# Patient Record
Sex: Male | Born: 1995 | Race: Black or African American | Hispanic: No | Marital: Single | Smoking: Never smoker
Health system: Southern US, Community
[De-identification: ages and names within clinical notes are randomized; demographics above are authoritative.]

## PROBLEM LIST (undated history)

## (undated) ENCOUNTER — Inpatient Hospital Stay: Admission: EM | Payer: Self-pay | Source: Home / Self Care

## (undated) VITALS — BP 106/72 | HR 90 | Temp 98.3°F | Resp 18 | Ht 73.62 in | Wt 245.8 lb

## (undated) DIAGNOSIS — J302 Other seasonal allergic rhinitis: Secondary | ICD-10-CM

## (undated) DIAGNOSIS — S83289A Other tear of lateral meniscus, current injury, unspecified knee, initial encounter: Secondary | ICD-10-CM

## (undated) DIAGNOSIS — Z8614 Personal history of Methicillin resistant Staphylococcus aureus infection: Secondary | ICD-10-CM

## (undated) DIAGNOSIS — S83519A Sprain of anterior cruciate ligament of unspecified knee, initial encounter: Secondary | ICD-10-CM

## (undated) HISTORY — PX: TYMPANOSTOMY TUBE PLACEMENT: SHX32

## (undated) HISTORY — DX: Sprain of anterior cruciate ligament of unspecified knee, initial encounter: S83.519A

---
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
## 2018-04-20 ENCOUNTER — Emergency Department (HOSPITAL_COMMUNITY)
Admission: EM | Admit: 2018-04-20 | Discharge: 2018-04-21 | Disposition: A | Discharge status: Home / Self Care | Payer: PRIVATE HEALTH INSURANCE | Attending: Emergency Medicine | Admitting: Emergency Medicine

## 2018-04-20 ENCOUNTER — Other Ambulatory Visit: Payer: Self-pay

## 2018-04-20 ENCOUNTER — Encounter (HOSPITAL_COMMUNITY): Payer: Self-pay | Admitting: Emergency Medicine

## 2018-04-20 DIAGNOSIS — Z79899 Other long term (current) drug therapy: Secondary | ICD-10-CM | POA: Insufficient documentation

## 2018-04-20 DIAGNOSIS — F32A Depression, unspecified: Secondary | ICD-10-CM

## 2018-04-20 DIAGNOSIS — F329 Major depressive disorder, single episode, unspecified: Secondary | ICD-10-CM

## 2018-04-20 DIAGNOSIS — R45851 Suicidal ideations: Secondary | ICD-10-CM | POA: Insufficient documentation

## 2018-04-20 LAB — CBC
HCT: 39.7 % (ref 39.0–52.0)
Hemoglobin: 13 g/dL (ref 13.0–17.0)
MCH: 28.8 pg (ref 26.0–34.0)
MCHC: 32.7 g/dL (ref 30.0–36.0)
MCV: 87.8 fL (ref 80.0–100.0)
Platelets: 249 10*3/uL (ref 150–400)
RBC: 4.52 MIL/uL (ref 4.22–5.81)
RDW: 11.8 % (ref 11.5–15.5)
WBC: 8.7 10*3/uL (ref 4.0–10.5)
nRBC: 0 % (ref 0.0–0.2)

## 2018-04-20 NOTE — ED Notes (Signed)
ED Provider at bedside. 

## 2018-04-20 NOTE — ED Provider Notes (Signed)
[facility] Provider Note   CSN: [id number] Arrival date & time: 04/20/18  2308     History   Chief Complaint Chief Complaint  Patient presents with  . Suicidal    HPI George Cole is a 22 y.o. male.  Patient to ED with complaint of depression with SI. He states he had suicidal thoughts earlier today but none now. History of suicide attempt in the past. No HI/AVH. He denies substance abuse issues.   The history is provided by the patient and the police.    Past Medical History:  Diagnosis Date  . ACL sprain   . History of MRSA infection    right knee  . Lateral meniscal tear 04/2013   left knee  . Seasonal allergies     Patient Active Problem List   Diagnosis Date Noted  . Seasonal allergies   . History of MRSA infection   . ACL sprain   . Lateral meniscal tear 04/28/2013  . Suicidal ideation 10/07/2011  . Depression 10/07/2011  . Anxiety disorder 10/07/2011    Past Surgical History:  Procedure Laterality Date  . KNEE ARTHROSCOPY W/ MENISCAL REPAIR Left 2014  . KNEE ARTHROSCOPY WITH LATERAL MENISECTOMY Left 05/06/2013   Procedure: LEFT KNEE ARTHROSCOPY WITH LATERAL MENISCECTOMY;  Surgeon: Nilda Simmerobert A Wainer, MD;  Location: [facility];  Service: Orthopedics;  Laterality: Left;  . TYMPANOSTOMY TUBE PLACEMENT     x 2 as a child        Home Medications    Prior to Admission medications   Medication Sig Start Date End Date Taking? Authorizing Provider  HYDROcodone-acetaminophen (NORCO) 5-325 MG per tablet 1-2 tablets every 4-6 hrs as needed for pain Patient not taking: Reported on 05/20/2014 05/06/13   Shepperson, Kirstin, PA-C  levocetirizine (XYZAL) 5 MG tablet Take 5 mg by mouth every evening.    [provider]  mometasone (NASONEX) 50 MCG/ACT nasal spray Place 2 sprays into the nose daily.    [provider]    Family History Family History  Problem Relation Age of Onset  .  Hypertension Father     Social History Social History   Tobacco Use  . Smoking status: Never Smoker  . Smokeless tobacco: Never Used  Substance Use Topics  . Alcohol use: No  . Drug use: No     Allergies   Patient has no known allergies.   Review of Systems Review of Systems  Constitutional: Negative for chills and fever.  HENT: Negative.   Respiratory: Negative.   Cardiovascular: Negative.   Gastrointestinal: Negative.   Musculoskeletal: Negative.   Skin: Negative.   Neurological: Negative.   Psychiatric/Behavioral: Positive for dysphoric mood and suicidal ideas.     Physical Exam Updated Vital Signs BP (!) 156/76 (BP Location: Right Arm)   Pulse 76   Temp (!) 97.5 F (36.4 C) (Oral)   Ht 6\' 1"  (1.854 m)   Wt 102.1 kg   SpO2 100%   BMI 29.69 kg/m   Physical Exam Constitutional:      Appearance: He is well-developed.  HENT:     Head: Normocephalic.  Neck:     Musculoskeletal: Normal range of motion and neck supple.  Cardiovascular:     Rate and Rhythm: Normal rate and regular rhythm.     Heart sounds: No murmur.  Pulmonary:     Effort: Pulmonary effort is normal.     Breath sounds: Normal breath sounds. No wheezing, rhonchi or  rales.  Abdominal:     General: Bowel sounds are normal.     Palpations: Abdomen is soft.     Tenderness: There is no abdominal tenderness. There is no guarding or rebound.  Musculoskeletal: Normal range of motion.  Skin:    General: Skin is warm and dry.     Findings: No rash.  Neurological:     General: No focal deficit present.     Mental Status: He is alert and oriented to person, place, and time.  Psychiatric:        Attention and Perception: Attention normal. He does not perceive auditory or visual hallucinations.        Mood and Affect: Mood is depressed.        Speech: Speech normal.        Behavior: Behavior is cooperative.        Thought Content: Thought content includes suicidal ideation.      ED  Treatments / Results  Labs (all labs ordered are listed, but only abnormal results are displayed) Labs Reviewed  COMPREHENSIVE METABOLIC PANEL  ETHANOL  SALICYLATE LEVEL  ACETAMINOPHEN LEVEL  CBC  RAPID URINE DRUG SCREEN, HOSP PERFORMED    EKG None  Radiology No results found.  Procedures Procedures (including critical care time)  Medications Ordered in ED Medications - No data to display   Initial Impression / Assessment and Plan / ED Course  I have reviewed the triage vital signs and the nursing notes.  Pertinent labs & imaging results that were available during my care of the patient were reviewed by me and considered in my medical decision making (see chart for details).     Patient to ED with depression. SI earlier today, history of attempt. No physical complaints. Denies substance abuse issues. No self harm prior to arrival.   He will need TTS consultation to determine disposition.   Per TTS, patient will be held for psychiatric evaluation in the am  Final Clinical Impressions(s) / ED Diagnoses   Final diagnoses:  None   1. Depression 2. SI  ED Discharge Orders    None       Elpidio AnisUpstill, Mackenzye Mackel, Cordelia Poche-C 04/22/18 [id number]    Tegeler, Canary Brimhristopher J, MD 04/22/18 1020

## 2018-04-20 NOTE — ED Triage Notes (Signed)
Pt brought in by [employer] reports reports he has been going through a lot recently and is becoming overwhelmed, depressed. Pt reports SI earlier with no plan, denies at this time. Denies HI or hallucinations.

## 2018-04-20 NOTE — ED Notes (Signed)
Pt changed into purple scrubs. Security notifed for wanding. Staffing notifed for sitter. Pt's belongings placed in Pod F locker # , not inventoried at this time.

## 2018-04-20 NOTE — BH Assessment (Signed)
[facility] Assessment Progress Note   Pt is not yet in purple zone.  Still in triage.  TTS to be called when patient is ready.

## 2018-04-21 ENCOUNTER — Encounter (HOSPITAL_COMMUNITY): Payer: Self-pay | Admitting: Registered Nurse

## 2018-04-21 LAB — COMPREHENSIVE METABOLIC PANEL
ALT: 34 U/L (ref 0–44)
AST: 28 U/L (ref 15–41)
Albumin: 4.1 g/dL (ref 3.5–5.0)
Alkaline Phosphatase: 40 U/L (ref 38–126)
Anion gap: 9 (ref 5–15)
BUN: 15 mg/dL (ref 6–20)
CO2: 23 mmol/L (ref 22–32)
Calcium: 9.3 mg/dL (ref 8.9–10.3)
Chloride: 105 mmol/L (ref 98–111)
Creatinine, Ser: 0.92 mg/dL (ref 0.61–1.24)
GFR calc Af Amer: >60 mL/min (ref >60)
GFR calc non Af Amer: >60 mL/min (ref >60)
Glucose, Bld: 95 mg/dL (ref 70–99)
POTASSIUM: 3.8 mmol/L (ref 3.5–5.1)
Sodium: 137 mmol/L (ref 135–145)
Total Bilirubin: 1 mg/dL (ref 0.3–1.2)
Total Protein: 6.8 g/dL (ref 6.5–8.1)

## 2018-04-21 LAB — RAPID URINE DRUG SCREEN, HOSP PERFORMED
Amphetamines: NOT DETECTED
Barbiturates: NOT DETECTED
Benzodiazepines: NOT DETECTED
Cocaine: NOT DETECTED
Opiates: NOT DETECTED
Tetrahydrocannabinol: POSITIVE — AB

## 2018-04-21 LAB — ETHANOL: Alcohol, Ethyl (B): <10 mg/dL

## 2018-04-21 LAB — SALICYLATE LEVEL: Salicylate Lvl: <7 mg/dL (ref 2.8–30.0)

## 2018-04-21 LAB — ACETAMINOPHEN LEVEL: Acetaminophen (Tylenol), Serum: <10 ug/mL — ABNORMAL LOW (ref 10–30)
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
## 2018-04-21 NOTE — ED Notes (Signed)
TTS completed. 
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
## 2018-04-21 NOTE — ED Provider Notes (Signed)
Psychiatry cleared the patient.  Will arrange for outpatient follow-up.  Discharged in good condition.   Virgina NorfolkCuratolo, Olita Takeshita, DO 04/21/18 1007

## 2019-02-24 ENCOUNTER — Telehealth (HOSPITAL_COMMUNITY): Payer: Self-pay | Admitting: Psychiatry

## 2019-02-24 NOTE — Telephone Encounter (Signed)
D:  Pt phoned stating that [employer] referred him for group.  Based on his symptoms (ie. Paranoia with SI); writer transferred his call to [facility]. A:  Will inform PHP Team.

## 2019-02-25 ENCOUNTER — Telehealth (HOSPITAL_COMMUNITY): Payer: Self-pay | Admitting: Professional
